# Patient Record
Sex: Male | Born: 2000 | Race: White | Hispanic: No | Marital: Single | Smoking: Never smoker
Health system: Southern US, Community
[De-identification: ages and names within clinical notes are randomized; demographics above are authoritative.]

## PROBLEM LIST (undated history)

## (undated) DIAGNOSIS — E031 Congenital hypothyroidism without goiter: Secondary | ICD-10-CM

## (undated) HISTORY — PX: CLOSED REDUCTION WRIST FRACTURE: SHX1091

## (undated) HISTORY — DX: Congenital hypothyroidism without goiter: E03.1

## (undated) HISTORY — PX: CIRCUMCISION: SUR203

---
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
## 2022-02-12 DIAGNOSIS — J069 Acute upper respiratory infection, unspecified: Secondary | ICD-10-CM | POA: Diagnosis not present

## 2022-02-12 DIAGNOSIS — Z1152 Encounter for screening for COVID-19: Secondary | ICD-10-CM | POA: Diagnosis not present

## 2022-02-24 DIAGNOSIS — Z79899 Other long term (current) drug therapy: Secondary | ICD-10-CM | POA: Diagnosis not present

## 2022-02-24 DIAGNOSIS — F909 Attention-deficit hyperactivity disorder, unspecified type: Secondary | ICD-10-CM | POA: Diagnosis not present

## 2022-04-21 DIAGNOSIS — Z113 Encounter for screening for infections with a predominantly sexual mode of transmission: Secondary | ICD-10-CM | POA: Diagnosis not present

## 2022-04-21 DIAGNOSIS — F909 Attention-deficit hyperactivity disorder, unspecified type: Secondary | ICD-10-CM | POA: Diagnosis not present

## 2022-04-21 DIAGNOSIS — Z0000 Encounter for general adult medical examination without abnormal findings: Secondary | ICD-10-CM | POA: Diagnosis not present

## 2022-04-21 DIAGNOSIS — Z713 Dietary counseling and surveillance: Secondary | ICD-10-CM | POA: Diagnosis not present

## 2022-04-21 DIAGNOSIS — Z1331 Encounter for screening for depression: Secondary | ICD-10-CM | POA: Diagnosis not present

## 2022-04-21 DIAGNOSIS — E031 Congenital hypothyroidism without goiter: Secondary | ICD-10-CM | POA: Diagnosis not present

## 2022-04-22 DIAGNOSIS — Z1331 Encounter for screening for depression: Secondary | ICD-10-CM | POA: Diagnosis not present

## 2022-04-22 DIAGNOSIS — Z0000 Encounter for general adult medical examination without abnormal findings: Secondary | ICD-10-CM | POA: Diagnosis not present

## 2022-05-04 ENCOUNTER — Telehealth (INDEPENDENT_AMBULATORY_CARE_PROVIDER_SITE_OTHER): Payer: Self-pay | Admitting: Pediatric Endocrinology

## 2022-05-04 NOTE — Telephone Encounter (Signed)
Attempted to call patient, no answer, left HIPAA approved message to call back or check mychart.

## 2022-05-04 NOTE — Telephone Encounter (Signed)
Returned call to patient to relay Dr. Montey Hora message to see PCP or student health first.  He verbalized understanding.

## 2022-05-04 NOTE — Telephone Encounter (Signed)
Patient called back.  He stated he has spots on neck.  Does not itch, no pain or discomfort.  It showed up a few months ago, it has spread. It has not improved.  The spots are lighter color on his skin.  He will try to upload pictures on mychart.  He is at school in [place], he has not had [facility] look at that yet.  He does have an upcoming appt. With Dr. Baldo Ash in May.  Told him I will have Dr. Baldo Ash advise what she would like him to do, it may be to move his appt up, have labs drawn or go see a local provider.  He verbalized understanding.

## 2022-05-04 NOTE — Telephone Encounter (Signed)
See his PCP or student health as first level. Thanks.

## 2022-05-04 NOTE — Telephone Encounter (Signed)
Who's calling (name and relationship to patient) : Yacqub Cicotte; mom   Best contact number: [phone]  Provider they see: Dr. Baldo Ash  Reason for call: Mom called in stating that Jaydon has a Discoloration on his skin and it has gotten worse over that past 6 months. She is requesting to speak with a nurse or Dr. Baldo Ash.  She has also provided a contact number for Souleymane- [phone].  FYI: there is no DPR on file, one will be emailed.   Call ID:      PRESCRIPTION REFILL ONLY  Name of prescription:  Pharmacy:

## 2022-05-05 DIAGNOSIS — R21 Rash and other nonspecific skin eruption: Secondary | ICD-10-CM | POA: Diagnosis not present

## 2022-05-14 DIAGNOSIS — B360 Pityriasis versicolor: Secondary | ICD-10-CM | POA: Diagnosis not present

## 2022-08-05 ENCOUNTER — Other Ambulatory Visit (INDEPENDENT_AMBULATORY_CARE_PROVIDER_SITE_OTHER): Payer: Self-pay | Admitting: Pediatric Endocrinology

## 2022-08-05 DIAGNOSIS — E031 Congenital hypothyroidism without goiter: Secondary | ICD-10-CM

## 2022-08-05 NOTE — Telephone Encounter (Signed)
Will hold till tomorrow. Thanks for checking!

## 2022-08-06 ENCOUNTER — Ambulatory Visit (INDEPENDENT_AMBULATORY_CARE_PROVIDER_SITE_OTHER): Payer: Self-pay | Admitting: Pediatric Endocrinology

## 2022-08-20 ENCOUNTER — Telehealth (INDEPENDENT_AMBULATORY_CARE_PROVIDER_SITE_OTHER): Payer: Self-pay | Admitting: Pediatric Endocrinology

## 2022-08-20 DIAGNOSIS — E031 Congenital hypothyroidism without goiter: Secondary | ICD-10-CM

## 2022-08-20 MED ORDER — SYNTHROID 50 MCG PO TABS: 50.0000 ug | ORAL_TABLET | Freq: Every day | ORAL | 3 refills | Status: AC

## 2022-08-20 NOTE — Telephone Encounter (Signed)
I don't understand the request. They can print out from his MyChart if they need proof of medication being prescribed- but he is not a minor?

## 2022-08-20 NOTE — Telephone Encounter (Signed)
  Name of who is calling: Earnest Rosier  Caller's Relationship to Patient: Mom  Best contact number: [phone]  Provider they see: Dr Vanessa Redfield  Reason for call: patient is out of Synthroid medication and needs script for medication. Mom says they have a document from pharmacy that says patient needs a written prescription.   PRESCRIPTION REFILL ONLY  Name of prescription: Synthroid   Pharmacy:

## 2022-09-11 ENCOUNTER — Encounter (INDEPENDENT_AMBULATORY_CARE_PROVIDER_SITE_OTHER): Payer: Self-pay

## 2022-10-13 ENCOUNTER — Telehealth: Payer: Self-pay | Admitting: Pediatric Endocrinology

## 2022-10-13 DIAGNOSIS — F988 Other specified behavioral and emotional disorders with onset usually occurring in childhood and adolescence: Secondary | ICD-10-CM | POA: Diagnosis not present

## 2022-10-13 NOTE — Telephone Encounter (Signed)
  Name of who is calling: Brayton Mars  Caller's Relationship to Patient: Mother  Best contact number: [phone]  Provider they see: Vanessa [place]  Reason for call: Pt's mother called on behalf of patient to see if they could change their appt. From in person to virtual as the patient was leaving for college before the appt.

## 2022-10-19 ENCOUNTER — Ambulatory Visit (INDEPENDENT_AMBULATORY_CARE_PROVIDER_SITE_OTHER): Payer: Self-pay | Admitting: Pediatric Endocrinology

## 2022-11-25 ENCOUNTER — Telehealth: Payer: Self-pay | Admitting: Pediatric Endocrinology

## 2022-11-25 NOTE — Telephone Encounter (Signed)
Called to let mom know that we will NOT be able to refill his synthroid. Patient has not been seen since 07/2021. Patient will need to contact his PCP for refills. Unable to leave message, phone kept ringing.

## 2022-11-25 NOTE — Telephone Encounter (Signed)
  Name of who is calling: Wahab,Kristin   Caller's Relationship to Patient: Mother  Best contact number: [phone]   Provider they see: Vanessa Glas[place]   Reason for call: Patient's mother called in requesting a refill on Synthroid until the patient gets in with adult care.      PRESCRIPTION REFILL ONLY  Name of prescription:  Synthroid   [facility] [address]

## 2023-08-24 ENCOUNTER — Other Ambulatory Visit (INDEPENDENT_AMBULATORY_CARE_PROVIDER_SITE_OTHER): Payer: Self-pay | Admitting: Pediatric Endocrinology

## 2023-08-24 DIAGNOSIS — E031 Congenital hypothyroidism without goiter: Secondary | ICD-10-CM

## 2023-10-11 DIAGNOSIS — E031 Congenital hypothyroidism without goiter: Secondary | ICD-10-CM | POA: Diagnosis not present

## 2023-10-18 DIAGNOSIS — R82998 Other abnormal findings in urine: Secondary | ICD-10-CM | POA: Diagnosis not present
